# Patient Record
Sex: Male | Born: 1965 | Race: Black or African American | Hispanic: No | Smoking: Current every day smoker
Health system: Southern US, Community
[De-identification: ages and names within clinical notes are randomized; demographics above are authoritative.]

## PROBLEM LIST (undated history)

## (undated) DIAGNOSIS — I10 Essential (primary) hypertension: Secondary | ICD-10-CM

## (undated) DIAGNOSIS — R079 Chest pain, unspecified: Secondary | ICD-10-CM

## (undated) DIAGNOSIS — G44009 Cluster headache syndrome, unspecified, not intractable: Secondary | ICD-10-CM

## (undated) HISTORY — PX: WRIST SURGERY: SHX841

---
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
## 1989-01-02 HISTORY — PX: HAND SURGERY: SHX662

## 1997-08-02 ENCOUNTER — Emergency Department (HOSPITAL_COMMUNITY): Admission: EM | Admit: 1997-08-02 | Discharge: 1997-08-02 | Payer: Self-pay | Admitting: Emergency Medicine

## 2002-08-08 ENCOUNTER — Encounter: Payer: Self-pay | Admitting: Emergency Medicine

## 2002-08-08 ENCOUNTER — Emergency Department (HOSPITAL_COMMUNITY): Admission: EM | Admit: 2002-08-08 | Discharge: 2002-08-08 | Payer: Self-pay | Admitting: Emergency Medicine

## 2003-03-04 ENCOUNTER — Emergency Department (HOSPITAL_COMMUNITY): Admission: EM | Admit: 2003-03-04 | Discharge: 2003-03-04 | Payer: Self-pay | Admitting: Emergency Medicine

## 2003-07-13 ENCOUNTER — Emergency Department (HOSPITAL_COMMUNITY): Admission: EM | Admit: 2003-07-13 | Discharge: 2003-07-13 | Payer: Self-pay | Admitting: Emergency Medicine

## 2003-11-23 ENCOUNTER — Emergency Department (HOSPITAL_COMMUNITY): Admission: EM | Admit: 2003-11-23 | Discharge: 2003-11-24 | Payer: Self-pay | Admitting: Emergency Medicine

## 2003-12-06 ENCOUNTER — Emergency Department (HOSPITAL_COMMUNITY): Admission: EM | Admit: 2003-12-06 | Discharge: 2003-12-06 | Payer: Self-pay | Admitting: Emergency Medicine

## 2004-11-05 ENCOUNTER — Emergency Department (HOSPITAL_COMMUNITY): Admission: EM | Admit: 2004-11-05 | Discharge: 2004-11-05 | Payer: Self-pay | Admitting: Emergency Medicine

## 2005-06-30 ENCOUNTER — Emergency Department (HOSPITAL_COMMUNITY): Admission: EM | Admit: 2005-06-30 | Discharge: 2005-06-30 | Payer: Self-pay | Admitting: *Deleted

## 2006-02-08 ENCOUNTER — Emergency Department (HOSPITAL_COMMUNITY): Admission: EM | Admit: 2006-02-08 | Discharge: 2006-02-08 | Payer: Self-pay | Admitting: Emergency Medicine

## 2006-02-17 ENCOUNTER — Emergency Department (HOSPITAL_COMMUNITY): Admission: EM | Admit: 2006-02-17 | Discharge: 2006-02-17 | Payer: Self-pay | Admitting: Emergency Medicine

## 2009-05-24 ENCOUNTER — Emergency Department (HOSPITAL_COMMUNITY): Admission: EM | Admit: 2009-05-24 | Discharge: 2009-05-24 | Payer: Self-pay | Admitting: Emergency Medicine

## 2009-09-18 ENCOUNTER — Emergency Department (HOSPITAL_COMMUNITY): Admission: EM | Admit: 2009-09-18 | Discharge: 2009-09-19 | Payer: Self-pay | Admitting: Emergency Medicine

## 2009-09-20 ENCOUNTER — Ambulatory Visit (HOSPITAL_COMMUNITY): Admission: RE | Admit: 2009-09-20 | Discharge: 2009-09-21 | Payer: Self-pay | Admitting: Orthopedic Surgery

## 2009-11-26 ENCOUNTER — Emergency Department (HOSPITAL_COMMUNITY): Admission: EM | Admit: 2009-11-26 | Discharge: 2009-11-26 | Payer: Self-pay | Admitting: Emergency Medicine

## 2010-05-07 ENCOUNTER — Emergency Department (HOSPITAL_COMMUNITY)
Admission: EM | Admit: 2010-05-07 | Discharge: 2010-05-07 | Payer: Self-pay | Source: Home / Self Care | Admitting: Emergency Medicine

## 2010-05-07 LAB — BASIC METABOLIC PANEL
BUN: 11 mg/dL (ref 6–23)
CO2: 23 mEq/L (ref 19–32)
Calcium: 9.3 mg/dL (ref 8.4–10.5)
Chloride: 104 mEq/L (ref 96–112)
Creatinine, Ser: 1.07 mg/dL (ref 0.4–1.5)
GFR calc Af Amer: >60 mL/min (ref >60)
GFR calc non Af Amer: >60 mL/min (ref >60)
Glucose, Bld: 122 mg/dL — ABNORMAL HIGH (ref 70–99)
Potassium: 3.5 mEq/L (ref 3.5–5.1)
Sodium: 139 mEq/L (ref 135–145)

## 2010-05-07 LAB — CBC
HCT: 45.2 % (ref 39.0–52.0)
Hemoglobin: 16.1 g/dL (ref 13.0–17.0)
MCH: 32.7 pg (ref 26.0–34.0)
MCHC: 35.6 g/dL (ref 30.0–36.0)
MCV: 91.9 fL (ref 78.0–100.0)
Platelets: 229 10*3/uL (ref 150–400)
RBC: 4.92 MIL/uL (ref 4.22–5.81)
RDW: 13.2 % (ref 11.5–15.5)
WBC: 6.9 10*3/uL (ref 4.0–10.5)

## 2010-05-07 LAB — DIFFERENTIAL
Basophils Absolute: 0.1 10*3/uL (ref 0.0–0.1)
Basophils Relative: 1 % (ref 0–1)
Eosinophils Absolute: 0 10*3/uL (ref 0.0–0.7)
Eosinophils Relative: 0 % (ref 0–5)
Lymphocytes Relative: 19 % (ref 12–46)
Lymphs Abs: 1.3 10*3/uL (ref 0.7–4.0)
Monocytes Absolute: 0.7 10*3/uL (ref 0.1–1.0)
Monocytes Relative: 10 % (ref 3–12)
Neutro Abs: 4.9 10*3/uL (ref 1.7–7.7)
Neutrophils Relative %: 70 % (ref 43–77)

## 2010-05-07 LAB — POCT CARDIAC MARKERS
CKMB, poc: 1.8 ng/mL (ref 1.0–8.0)
CKMB, poc: 1.9 ng/mL (ref 1.0–8.0)
CKMB, poc: 1.9 ng/mL (ref 1.0–8.0)
Myoglobin, poc: 200 ng/mL (ref 12–200)
Myoglobin, poc: 102 ng/mL (ref 12–200)
Myoglobin, poc: 55.1 ng/mL (ref 12–200)
Troponin i, poc: <0.05 ng/mL (ref 0.00–0.09)
Troponin i, poc: <0.05 ng/mL (ref 0.00–0.09)
Troponin i, poc: <0.05 ng/mL (ref 0.00–0.09)

## 2010-05-07 LAB — D-DIMER, QUANTITATIVE: D-Dimer, Quant: <0.22 ug/mL-FEU (ref 0.00–0.48)

## 2010-07-21 LAB — CBC
HCT: 47.3 % (ref 39.0–52.0)
Hemoglobin: 16.3 g/dL (ref 13.0–17.0)
MCHC: 34.5 g/dL (ref 30.0–36.0)
MCV: 94.6 fL (ref 78.0–100.0)
Platelets: 257 10*3/uL (ref 150–400)
RBC: 5 MIL/uL (ref 4.22–5.81)
RDW: 13.7 % (ref 11.5–15.5)
WBC: 7.1 10*3/uL (ref 4.0–10.5)

## 2010-07-21 LAB — DIFFERENTIAL
Basophils Absolute: 0 10*3/uL (ref 0.0–0.1)
Basophils Relative: 1 % (ref 0–1)
Eosinophils Absolute: 0 10*3/uL (ref 0.0–0.7)
Eosinophils Relative: 0 % (ref 0–5)
Lymphocytes Relative: 29 % (ref 12–46)
Lymphs Abs: 2.1 10*3/uL (ref 0.7–4.0)
Monocytes Absolute: 0.7 10*3/uL (ref 0.1–1.0)
Monocytes Relative: 10 % (ref 3–12)
Neutro Abs: 4.3 10*3/uL (ref 1.7–7.7)
Neutrophils Relative %: 61 % (ref 43–77)

## 2010-07-21 LAB — BASIC METABOLIC PANEL
BUN: 6 mg/dL (ref 6–23)
CO2: 22 mEq/L (ref 19–32)
Calcium: 9.6 mg/dL (ref 8.4–10.5)
Chloride: 103 mEq/L (ref 96–112)
Creatinine, Ser: 1.12 mg/dL (ref 0.4–1.5)
GFR calc Af Amer: >60 mL/min (ref >60)
GFR calc non Af Amer: >60 mL/min (ref >60)
Glucose, Bld: 105 mg/dL — ABNORMAL HIGH (ref 70–99)
Potassium: 4.1 mEq/L (ref 3.5–5.1)
Sodium: 136 mEq/L (ref 135–145)

## 2010-07-21 LAB — PROTIME-INR
INR: 0.96 (ref 0.00–1.49)
Prothrombin Time: 12.7 seconds (ref 11.6–15.2)

## 2010-07-21 LAB — APTT: aPTT: 29 seconds (ref 24–37)

## 2011-03-11 ENCOUNTER — Encounter: Payer: Self-pay | Admitting: Emergency Medicine

## 2011-03-11 ENCOUNTER — Other Ambulatory Visit: Payer: Self-pay

## 2011-03-11 ENCOUNTER — Emergency Department (HOSPITAL_COMMUNITY): Payer: Self-pay

## 2011-03-11 ENCOUNTER — Emergency Department (HOSPITAL_COMMUNITY)
Admission: EM | Admit: 2011-03-11 | Discharge: 2011-03-11 | Disposition: A | Discharge status: Home / Self Care | Payer: Self-pay | Attending: Emergency Medicine | Admitting: Emergency Medicine

## 2011-03-11 DIAGNOSIS — R079 Chest pain, unspecified: Secondary | ICD-10-CM | POA: Insufficient documentation

## 2011-03-11 DIAGNOSIS — Z7982 Long term (current) use of aspirin: Secondary | ICD-10-CM | POA: Insufficient documentation

## 2011-03-11 DIAGNOSIS — R42 Dizziness and giddiness: Secondary | ICD-10-CM | POA: Insufficient documentation

## 2011-03-11 DIAGNOSIS — I10 Essential (primary) hypertension: Secondary | ICD-10-CM | POA: Insufficient documentation

## 2011-03-11 DIAGNOSIS — F17200 Nicotine dependence, unspecified, uncomplicated: Secondary | ICD-10-CM | POA: Insufficient documentation

## 2011-03-11 HISTORY — DX: Essential (primary) hypertension: I10

## 2011-03-11 LAB — POCT I-STAT TROPONIN I

## 2011-03-11 NOTE — ED Provider Notes (Signed)
History     CSN: [id number] Arrival date & time: 03/11/2011  6:09 PM   First MD Initiated Contact with Patient 03/11/11 1812      Chief Complaint  Patient presents with  . Chest Pain    (Consider location/radiation/quality/duration/timing/severity/associated sxs/prior treatment) The history is provided by the patient and medical records.   the patient is a 45 year old male, who smokes cigarettes.  He presents to the emergency department complaining of left-sided chest pain since 3 days ago.  He states that it is sharp and intermittent.  It lasts anywhere from minutes to up to an hour.  He has also had lightheadedness, which is so severe that he almost falls.  He denies shortness of breath, cough, nausea, vomiting, out.  The patient's leg pain or swelling.  He states he has had similar symptoms in the past and was evaluated in the emergency department.  He states that no etiology for his symptoms were found.  He denies a family history of coronary artery disease.  He has hypertension, but is not taking any medicines for it.  Past Medical History  Diagnosis Date  . Hypertension   . Migraine     Past Surgical History  Procedure Date  . Wrist surgery     left    No family history on file.  History  Substance Use Topics  . Smoking status: Current Everyday Smoker  . Smokeless tobacco: Never Used  . Alcohol Use: Yes     socail      Review of Systems  Constitutional: Negative for fever and diaphoresis.  HENT: Negative for neck pain.   Eyes: Negative for redness.  Respiratory: Negative for cough, chest tightness and shortness of breath.   Cardiovascular: Positive for chest pain. Negative for palpitations.  Gastrointestinal: Negative for nausea, vomiting, abdominal pain and diarrhea.  Musculoskeletal: Negative for back pain.  Neurological: Negative for headaches.  Psychiatric/Behavioral: Negative for confusion.    Allergies  Benadryl allergy  Home Medications   Current  Outpatient Rx  Name Route Sig Dispense Refill  . ASPIRIN 81 MG PO CHEW Oral Chew 81 mg by mouth daily.      Marland Kitchen FLUTICASONE PROPIONATE 50 MCG/ACT NA SUSP Nasal Place 2 sprays into the nose daily.        BP 131/89  Pulse 69  Temp(Src) 97.7 F (36.5 C) (Oral)  Resp 18  Wt 195 lb (88.451 kg)  SpO2 99%  Physical Exam  Constitutional: He is oriented to person, place, and time. He appears well-developed and well-nourished. No distress.  HENT:  Head: Normocephalic and atraumatic.  Eyes: EOM are normal. Pupils are equal, round, and reactive to light.  Neck: Normal range of motion. Neck supple.  Cardiovascular: Normal rate, regular rhythm, normal heart sounds and intact distal pulses.   No murmur heard. Pulmonary/Chest: Effort normal and breath sounds normal. No respiratory distress. He has no wheezes. He has no rales. He exhibits no tenderness.  Abdominal: Soft. Bowel sounds are normal. He exhibits no distension and no mass. There is no tenderness. There is no rebound and no guarding.  Musculoskeletal: Normal range of motion. He exhibits no edema and no tenderness.  Neurological: He is alert and oriented to person, place, and time. No cranial nerve deficit.  Skin: Skin is warm and dry. He is not diaphoretic.  Psychiatric: He has a normal mood and affect. His behavior is normal.    ED Course  Procedures (including critical care time)  45 year old male, with a history  of hypertension, presents with intermittent sharp, left-sided chest pain that does not radiate and is associated with no other symptoms of ACS.  He does have lightheadedness.  Recently, however, it does not correspond with the pain.  His physical examination is normal.  His EKG does not show signs of a STEMI at this time.  We will conduct a chest x-ray, and blood tests to look for signs of ACS, thoracic dissection, or other cardiopulmonary disease.  He does not want pain medications at this time.  ED ECG REPORT   Date:  03/11/2011  EKG Time: 18.08  Rate: 676  Rhythm: normal sinus rhythm,  Axis:normal  Intervals:none  ST&T Change: none   Narrative Interpretation:normal            Labs Reviewed  I-STAT TROPONIN I   No results found.   No diagnosis found.    MDM  Chest pain Intermittent chest pain that lasts 3 minutes to up to an hour without other signs of ACS.  Normal EKG, and no elevation of his troponin in the emergency department.  Asymptomatic in the emergency department.  I will release him to home.  No evidence of pneumonia or other significant illness        Nicholes Stairs, MD 03/11/11 2227

## 2011-03-11 NOTE — ED Notes (Signed)
Per ems pt began having left sided upper chest pain (sharp) on and off all day with increasing pain over past hour. Pt c/o nausea. Pt has hx htn and migraines and has not taken med in past month. Per ems bp 150/100 RR18 HR 80 with NSR on EKG, 4 SL nitro with no change in chest pain, #18G IV LFA, O2 @ 4L via [place], pt took 8- 81 mg ASA PTA. presently c/o dizziness and HA

## 2011-03-11 NOTE — ED Notes (Signed)
Report received from S. Buff, RN. 

## 2012-01-29 ENCOUNTER — Observation Stay (HOSPITAL_COMMUNITY)
Admission: EM | Admit: 2012-01-29 | Discharge: 2012-01-30 | Disposition: A | Discharge status: Home / Self Care | Payer: Self-pay | Attending: Internal Medicine | Admitting: Internal Medicine

## 2012-01-29 ENCOUNTER — Encounter (HOSPITAL_COMMUNITY): Payer: Self-pay | Admitting: Emergency Medicine

## 2012-01-29 DIAGNOSIS — I10 Essential (primary) hypertension: Secondary | ICD-10-CM

## 2012-01-29 DIAGNOSIS — L0291 Cutaneous abscess, unspecified: Secondary | ICD-10-CM

## 2012-01-29 DIAGNOSIS — L738 Other specified follicular disorders: Principal | ICD-10-CM | POA: Insufficient documentation

## 2012-01-29 DIAGNOSIS — L0390 Cellulitis, unspecified: Secondary | ICD-10-CM

## 2012-01-29 DIAGNOSIS — L739 Follicular disorder, unspecified: Secondary | ICD-10-CM

## 2012-01-29 LAB — BASIC METABOLIC PANEL
Chloride: 103 mEq/L (ref 96–112)
GFR calc Af Amer: >90 mL/min (ref >90)
Potassium: 3.8 mEq/L (ref 3.5–5.1)

## 2012-01-29 LAB — CBC
HCT: 47.1 % (ref 39.0–52.0)
Platelets: 315 10*3/uL (ref 150–400)
RDW: 12.9 % (ref 11.5–15.5)
WBC: 7.3 10*3/uL (ref 4.0–10.5)

## 2012-01-29 MED ORDER — PNEUMOCOCCAL VAC POLYVALENT 25 MCG/0.5ML IJ INJ: 0.5000 mL | INJECTION | INTRAMUSCULAR | Status: DC

## 2012-01-29 MED ORDER — SODIUM CHLORIDE 0.9 % IV SOLN
INTRAVENOUS | Status: DC
  Administered 2012-01-29: 15:00:00 via INTRAVENOUS

## 2012-01-29 MED ORDER — MORPHINE SULFATE 4 MG/ML IJ SOLN
4.0000 mg | Freq: Once | INTRAMUSCULAR | Status: AC
  Administered 2012-01-29: 4 mg via INTRAVENOUS
  Filled 2012-01-29: qty 1

## 2012-01-29 MED ORDER — CLINDAMYCIN PHOSPHATE 600 MG/50ML IV SOLN
600.0000 mg | Freq: Three times a day (TID) | INTRAVENOUS | Status: DC
  Administered 2012-01-29 – 2012-01-30 (×3): 600 mg via INTRAVENOUS
  Filled 2012-01-29 (×4): qty 50

## 2012-01-29 MED ORDER — ONDANSETRON HCL 4 MG/2ML IJ SOLN: 4.0000 mg | Freq: Four times a day (QID) | INTRAMUSCULAR | Status: DC | PRN

## 2012-01-29 MED ORDER — ACETAMINOPHEN 325 MG PO TABS: 650.0000 mg | ORAL_TABLET | Freq: Four times a day (QID) | ORAL | Status: DC | PRN

## 2012-01-29 MED ORDER — ENOXAPARIN SODIUM 40 MG/0.4ML ~~LOC~~ SOLN
40.0000 mg | SUBCUTANEOUS | Status: DC
  Filled 2012-01-29 (×2): qty 0.4

## 2012-01-29 MED ORDER — ACETAMINOPHEN 650 MG RE SUPP: 650.0000 mg | Freq: Four times a day (QID) | RECTAL | Status: DC | PRN

## 2012-01-29 MED ORDER — CLINDAMYCIN PHOSPHATE 600 MG/50ML IV SOLN
600.0000 mg | Freq: Once | INTRAVENOUS | Status: AC
  Administered 2012-01-29: 600 mg via INTRAVENOUS
  Filled 2012-01-29: qty 50

## 2012-01-29 MED ORDER — MORPHINE SULFATE 2 MG/ML IJ SOLN
1.0000 mg | INTRAMUSCULAR | Status: DC | PRN
  Filled 2012-01-29: qty 1

## 2012-01-29 MED ORDER — OXYCODONE HCL 5 MG PO TABS
5.0000 mg | ORAL_TABLET | ORAL | Status: DC | PRN
  Administered 2012-01-29: 5 mg via ORAL
  Filled 2012-01-29: qty 1

## 2012-01-29 MED ORDER — SENNOSIDES-DOCUSATE SODIUM 8.6-50 MG PO TABS
1.0000 | ORAL_TABLET | Freq: Every evening | ORAL | Status: DC | PRN
  Filled 2012-01-29: qty 1

## 2012-01-29 MED ORDER — ONDANSETRON HCL 4 MG PO TABS: 4.0000 mg | ORAL_TABLET | Freq: Four times a day (QID) | ORAL | Status: DC | PRN

## 2012-01-29 NOTE — Progress Notes (Signed)
Nutrition Brief Note  Patient identified on the Malnutrition Screening Tool (MST) report for unintended weight loss and poor appetite, generating a score of 3.   Body mass index is 26.85 kg/(m^2). Pt meets criteria for overweight based on current BMI.   - Pt reports "eating like a hog" PTA with great appetite. Pt reports 2 pound intentional weight loss in the past week or so r/t pt walking more. Pt without any nutrition educational needs. No nutrition interventions warranted at this time. If nutrition issues arise, please consult RD.   Levon Hedger MS, RD, LDN [phone] Pager [phone] After Hours Pager

## 2012-01-29 NOTE — H&P (Addendum)
[facility]          History and Physical    PCP:   No primary provider on file.   Chief Complaint:  Pain and swelling of upper thigh area.  HPI: 46 y/o man with PMH significant for HTN, who has not had routine medical care in years, presents with the above-mentioned complaints. 2 weeks ago he had a "boil" pop up in his groin. This burst and drained some pus and resolved spontaneously. Since then he has had 3-4 other similar lesions occur. They are quite painful and because of the location, difficult ambulation. I am seeing him up on the floor as he had already been accepted for admission by the time I see him. Denies fevers/chills.  Allergies:   Allergies  Allergen Reactions  . Diphenhydramine Hcl Other (See Comments)    Confusion, "I went crazy"      Past Medical History  Diagnosis Date  . Hypertension   . Migraine     Past Surgical History  Procedure Date  . Wrist surgery     left    Prior to Admission medications   Medication Sig Start Date End Date Taking? Authorizing Provider  ibuprofen (ADVIL,MOTRIN) 200 MG tablet Take 800 mg by mouth every 6 (six) hours as needed. PAIN   Yes Historical Provider, MD  PRESCRIPTION MEDICATION Take 1 tablet by mouth daily. PT STATES HE TAKES A BLOOD PRESSURE HOWEVER CANT FIND IT AT PT'S PHARMACY   Yes Historical Provider, MD    Social History:  reports that he has been smoking Cigarettes.  He has a 25 pack-year smoking history. He has never used smokeless tobacco. He reports that he drinks alcohol. He reports that he does not use illicit drugs.  History reviewed. No pertinent family history.  Review of Systems:  Constitutional: Denies fever, chills, diaphoresis, appetite change and fatigue.  HEENT: Denies photophobia, eye pain, redness, hearing loss, ear pain, congestion, sore throat, rhinorrhea, sneezing, mouth sores, trouble swallowing, neck pain, neck stiffness and tinnitus.   Respiratory: Denies SOB, DOE,  cough, chest tightness,  and wheezing.   Cardiovascular: Denies chest pain, palpitations and leg swelling.  Gastrointestinal: Denies nausea, vomiting, abdominal pain, diarrhea, constipation, blood in stool and abdominal distention.  Genitourinary: Denies dysuria, urgency, frequency, hematuria, flank pain and difficulty urinating.  Musculoskeletal: Denies myalgias, back pain, joint swelling, arthralgias and gait problem.  Skin: Denies pallor, rash and wound.  Neurological: Denies dizziness, seizures, syncope, weakness, light-headedness, numbness and headaches.  Hematological: Denies adenopathy. Easy bruising, personal or family bleeding history  Psychiatric/Behavioral: Denies suicidal ideation, mood changes, confusion, nervousness, sleep disturbance and agitation   Physical Exam: Blood pressure 144/81, pulse 85, temperature 97.6 F (36.4 C), temperature source Oral, resp. rate 18, height 6' (1.829 m), weight 89.812 kg (198 lb), SpO2 94.00%. Gen: AAOx3, mild headache. HEENT: [place]/AT/PERRL/EOMI, moist mucous membranes. Neck: supple, no JVD, no LAD, no bruits, no goiter. CV: RRR, no M/R/G Lungs: CTA B Abd: S/NT/ND/+BS/no masses or organomegaly noted. Ext: no C/C/E, +pedal pulses. Neuro: grossly intact and nonfocal.   Labs on Admission:  Results for orders placed during the hospital encounter of 01/29/12 (from the past 48 hour(s))  CBC     Status: Normal   Collection Time   01/29/12  9:00 AM      Component Value Range Comment   WBC 7.3  4.0 - 10.5 K/uL    RBC 5.32  4.22 - 5.81 MIL/uL    Hemoglobin 16.8  13.0 - 17.0 g/dL  HCT 47.1  39.0 - 52.0 %    MCV 88.5  78.0 - 100.0 fL    MCH 31.6  26.0 - 34.0 pg    MCHC 35.7  30.0 - 36.0 g/dL    RDW 82.9  56.2 - 13.0 %    Platelets 315  150 - 400 K/uL   BASIC METABOLIC PANEL     Status: Normal   Collection Time   01/29/12  9:00 AM      Component Value Range Comment   Sodium 140  135 - 145 mEq/L    Potassium 3.8  3.5 - 5.1 mEq/L    Chloride  103  96 - 112 mEq/L    CO2 23  19 - 32 mEq/L    Glucose, Bld 86  70 - 99 mg/dL    BUN 6  6 - 23 mg/dL    Creatinine, Ser 8.65  0.50 - 1.35 mg/dL    Calcium 8.7  8.4 - 78.4 mg/dL    GFR calc non Af Amer >90  >90 mL/min    GFR calc Af Amer >90  >90 mL/min     Radiological Exams on Admission: No results found.  Assessment/Plan Principal Problem:  *Folliculitis Active Problems:  HTN (hypertension)   Folliculitis -Prior bouts have resolved spontaneously. -Has already been accepted for admission and is on the floor. -Continue IV clindamycin today. -Plan to transition to PO abx in am for DC.  HTN -Not currently on meds. -No routine medical care. -Follow BP, but no plans to start treatment while here unless BP markedly elevated.  DVT Prophylaxis -Lovenox.  Disposition -Plan for DC home in am. -Will ask CM to assist with PCP follow up needs.   Time Spent on Admission: 35 minutes  Benjamin Novak,Benjamin Novak [facility] Pager: [phone] 01/29/2012, 11:36 AM

## 2012-01-29 NOTE — Progress Notes (Signed)
Pt is 46 year old male with history of HTN who presents to ED with worsening erythema of scrotal area, bilateral upper thighs.  Patient was started on clindamycin per ED physician. Vital signs reviewed. May admit to med-surg floor.  Manson Passey [facility]  [phone]

## 2012-01-29 NOTE — ED Notes (Signed)
States had a boil that started 2 weeks ago in perineal area, went away-- now has 4-5 on both thighs and scrotal area. Draining greenish pus.

## 2012-01-29 NOTE — ED Provider Notes (Signed)
History     CSN: [id number]  Arrival date & time 01/29/12  0756   First MD Initiated Contact with Patient 01/29/12 [phone]      Chief Complaint  Patient presents with  . boils   . Abscess    (Consider location/radiation/quality/duration/timing/severity/associated sxs/prior treatment) HPI Comments: Mr. Keena reports that he has developed multiple painful red spots and boils all over his upper legs.  He had 1 are on his left groin that became swollen, drained pus, then resolved; however shortly thereafter multiple other areas lesions have erupted.  He denies ongoing groin or scrotal pain/swelling.   He denies any other medical problems other than hypertension and migraine headaches.   Patient is a 46 y.o. male presenting with abscess. The history is provided by the patient. No language interpreter was used.  Abscess  The current episode started more than one week ago (2 weeks ago). The onset was gradual. The problem occurs continuously. The problem has been gradually worsening. The abscess is present on the groin, left upper leg and left lower leg. The problem is severe. The abscess is characterized by redness, painfulness, blistering and swelling. The abscess first occurred at home. Pertinent negatives include no anorexia, no decrease in physical activity, not sleeping less, no fever, not sleeping more, no vomiting, no rhinorrhea, no decreased responsiveness and no cough. His past medical history is significant for skin abscesses in family. Past medical history comments: reports his sister-in-law has had a recent similar eruption. He has received no recent medical care.    Past Medical History  Diagnosis Date  . Hypertension   . Migraine     Past Surgical History  Procedure Date  . Wrist surgery     left    History reviewed. No pertinent family history.  History  Substance Use Topics  . Smoking status: Current Every Day Smoker  . Smokeless tobacco: Never Used  . Alcohol Use:  Yes     social       Review of Systems  Constitutional: Negative for fever, diaphoresis, activity change, appetite change, decreased responsiveness and fatigue.  HENT: Negative.  Negative for rhinorrhea.   Eyes: Negative.   Respiratory: Negative for cough.   Gastrointestinal: Negative for vomiting and anorexia.  Genitourinary: Negative.   Musculoskeletal: Positive for myalgias. Negative for back pain, arthralgias and gait problem.  Skin: Positive for color change, rash and wound. Negative for pallor.  Neurological: Negative for dizziness, tremors, weakness and light-headedness.  Psychiatric/Behavioral: Negative.     Allergies  Diphenhydramine hcl  Home Medications   Current Outpatient Rx  Name Route Sig Dispense Refill  . IBUPROFEN 200 MG PO TABS Oral Take 800 mg by mouth every 6 (six) hours as needed. PAIN    . PRESCRIPTION MEDICATION Oral Take 1 tablet by mouth daily. PT STATES HE TAKES A BLOOD PRESSURE HOWEVER CANT FIND IT AT PT'S PHARMACY      BP 120/98  Pulse 101  Temp 98.7 F (37.1 C) (Oral)  Resp 16  SpO2 98%  Physical Exam  Nursing note and vitals reviewed. Constitutional: He is oriented to person, place, and time. He appears well-developed and well-nourished. No distress.  HENT:  Head: Normocephalic and atraumatic.  Right Ear: External ear normal.  Left Ear: External ear normal.  Mouth/Throat: Oropharynx is clear and moist. No oropharyngeal exudate.  Eyes: Conjunctivae normal and EOM are normal. Pupils are equal, round, and reactive to light. Right eye exhibits no discharge. Left eye exhibits no discharge. No scleral  icterus.  Neck: Normal range of motion. Neck supple. No JVD present. No tracheal deviation present. No thyromegaly present.  Cardiovascular: Normal rate, regular rhythm, normal heart sounds and intact distal pulses.  Exam reveals no gallop and no friction rub.   No murmur heard. Pulmonary/Chest: Effort normal and breath sounds normal. No stridor.  No respiratory distress. He has no wheezes. He has no rales. He exhibits no tenderness.  Abdominal: Soft. Bowel sounds are normal. He exhibits no distension and no mass. There is no tenderness. There is no rebound and no guarding.  Musculoskeletal: Normal range of motion. He exhibits no edema and no tenderness.  Lymphadenopathy:    He has no cervical adenopathy.  Neurological: He is alert and oriented to person, place, and time.  Skin: Skin is warm and dry. Lesion and rash noted. No abrasion, no bruising, no burn, no ecchymosis, no laceration and no petechiae noted. Rash is pustular. He is not diaphoretic. There is erythema. No cyanosis. No pallor. Nails show no clubbing.          Multiple erythematous patches on anterior and medial thighs. Some have overlying pustules.  Several other darkened dry areas with closed sinuses noted  Psychiatric: He has a normal mood and affect. His behavior is normal.    ED Course  Procedures (including critical care time)   Labs Reviewed  CBC  BASIC METABOLIC PANEL   No results found.   No diagnosis found.    MDM  Pt presents for evaluation of multiple erythematous areas on his legs.  It started as a "boil" in his  Groin which ruptured and improved.  He appears uncomfortable  But stable, afebrile, NAD.  Plan basic labs, pain mgmnt, and IV clindamycin.  Some of the areas are tense with small overlying blisters.  No palpable area of fluctuance appreciated. 1478.  Pt stable, NAD.  Note nl CBC and BNP.  Administered clindamycin.  Secondary to multiple small bilateral cellulitic areas, plan observation admission for continued parenteral antibiotics.  Pt at this time demonstrates no clinical evidence of a sepsis and otherwise appears well.      Tobin Chad, MD 01/29/12 1003

## 2012-01-30 LAB — CBC
HCT: 44.9 % (ref 39.0–52.0)
MCHC: 34.3 g/dL (ref 30.0–36.0)
MCV: 90.5 fL (ref 78.0–100.0)
RDW: 13.3 % (ref 11.5–15.5)

## 2012-01-30 LAB — BASIC METABOLIC PANEL
BUN: 18 mg/dL (ref 6–23)
CO2: 26 mEq/L (ref 19–32)
Chloride: 101 mEq/L (ref 96–112)
Creatinine, Ser: 1.09 mg/dL (ref 0.50–1.35)

## 2012-01-30 MED ORDER — DOXYCYCLINE HYCLATE 100 MG PO TABS: 100.0000 mg | ORAL_TABLET | Freq: Two times a day (BID) | ORAL | Status: DC

## 2012-01-30 NOTE — Progress Notes (Signed)
Pt [place] cigarettes placed in shadow chart with lighter.

## 2012-01-30 NOTE — Progress Notes (Signed)
After receiving and signing for his instructions and prescription, I escort him to [facility] for his ride home.  This is accomplished without incident; and he ambulates without difficulty.

## 2012-01-30 NOTE — Discharge Summary (Signed)
Physician Discharge Summary  Patient ID: Benjamin Novak MRN: [id number] DOB/AGE: 1966-01-14 46 y.o.  Admit date: 01/29/2012 Discharge date: 01/30/2012  Primary Care Physician:  No primary provider on file.   Discharge Diagnoses:    Principal Problem:  *Folliculitis Active Problems:  HTN (hypertension)      Medication List     As of 01/30/2012  9:58 AM    STOP taking these medications         PRESCRIPTION MEDICATION      TAKE these medications         doxycycline 100 MG tablet   Commonly known as: VIBRA-TABS   Take 1 tablet (100 mg total) by mouth 2 (two) times daily.      ibuprofen 200 MG tablet   Commonly known as: ADVIL,MOTRIN   Take 800 mg by mouth every 6 (six) hours as needed. PAIN         Disposition and Follow-up:  Will be discharged home today in stable and improved condition. Has been advised to follow up with his Primary care provider in 2 weeks.  Consults:  None   Significant Diagnostic Studies:  No results found.  Brief H and P: For complete details please refer to admission H and P, but in brief patient is a 46 y/o man with PMH significant for HTN, who has not had routine medical care in years, presents with pain and swelling of the upper thigh area. 2 weeks ago he had a "boil" pop up in his groin. This burst and drained some pus and resolved spontaneously. Since then he has had 3-4 other similar lesions occur. They are quite painful and because of the location, difficult ambulation. I am seeing him up on the floor as he had already been accepted for admission by the time I see him. Denies fevers/chills.     Hospital Course:  Principal Problem:  *Folliculitis Active Problems:  HTN (hypertension)    Folliculitis -No fever/leukocytosis. -Has received 24 hrs of IV clindamycin. -Will prescribe a 10 day course of doxycycline at time of DC. -Instructed to follow up with his PCP in 2 weeks if the lesions are still bothersome.  HTN -Has  not been taking any meds. -SBP 120-140 while in the hospital. -Will defer treatment of this to his OP provider as I do not think that this is the moment to start any new BP meds.  Time spent on Discharge: Less than 30 minutes.  SignedChaya Jan [facility] Pager: [phone] 01/30/2012, 9:58 AM

## 2012-11-08 ENCOUNTER — Ambulatory Visit: Payer: Self-pay

## 2012-11-08 ENCOUNTER — Other Ambulatory Visit: Payer: Self-pay | Admitting: Occupational Medicine

## 2012-11-08 DIAGNOSIS — M549 Dorsalgia, unspecified: Secondary | ICD-10-CM

## 2013-01-04 ENCOUNTER — Observation Stay (HOSPITAL_COMMUNITY)
Admission: EM | Admit: 2013-01-04 | Discharge: 2013-01-05 | Disposition: A | Discharge status: Home / Self Care | Payer: Self-pay | Attending: Internal Medicine | Admitting: Internal Medicine

## 2013-01-04 ENCOUNTER — Emergency Department (HOSPITAL_COMMUNITY): Payer: Self-pay

## 2013-01-04 ENCOUNTER — Encounter (HOSPITAL_COMMUNITY): Payer: Self-pay | Admitting: Emergency Medicine

## 2013-01-04 DIAGNOSIS — R079 Chest pain, unspecified: Principal | ICD-10-CM

## 2013-01-04 DIAGNOSIS — R42 Dizziness and giddiness: Secondary | ICD-10-CM | POA: Insufficient documentation

## 2013-01-04 DIAGNOSIS — F17200 Nicotine dependence, unspecified, uncomplicated: Secondary | ICD-10-CM | POA: Insufficient documentation

## 2013-01-04 DIAGNOSIS — I10 Essential (primary) hypertension: Secondary | ICD-10-CM | POA: Diagnosis present

## 2013-01-04 DIAGNOSIS — L739 Follicular disorder, unspecified: Secondary | ICD-10-CM

## 2013-01-04 DIAGNOSIS — Z720 Tobacco use: Secondary | ICD-10-CM

## 2013-01-04 LAB — URINALYSIS, ROUTINE W REFLEX MICROSCOPIC
Nitrite: NEGATIVE
Protein, ur: NEGATIVE mg/dL
Specific Gravity, Urine: 1.021 (ref 1.005–1.030)
Urobilinogen, UA: 0.2 mg/dL (ref 0.0–1.0)

## 2013-01-04 LAB — RAPID URINE DRUG SCREEN, HOSP PERFORMED
Cocaine: NOT DETECTED
Opiates: POSITIVE — AB
Tetrahydrocannabinol: NOT DETECTED

## 2013-01-04 LAB — BASIC METABOLIC PANEL
Calcium: 9 mg/dL (ref 8.4–10.5)
GFR calc non Af Amer: >90 mL/min (ref >90)
Glucose, Bld: 114 mg/dL — ABNORMAL HIGH (ref 70–99)
Sodium: 137 mEq/L (ref 135–145)

## 2013-01-04 LAB — CBC
Hemoglobin: 15.5 g/dL (ref 13.0–17.0)
MCH: 32.4 pg (ref 26.0–34.0)
MCHC: 35.6 g/dL (ref 30.0–36.0)
Platelets: 297 10*3/uL (ref 150–400)

## 2013-01-04 LAB — TROPONIN I: Troponin I: <0.3 ng/mL (ref <0.30)

## 2013-01-04 LAB — URINE MICROSCOPIC-ADD ON

## 2013-01-04 LAB — POCT I-STAT TROPONIN I: Troponin i, poc: 0.02 ng/mL (ref 0.00–0.08)

## 2013-01-04 MED ORDER — ACETAMINOPHEN 325 MG PO TABS
650.0000 mg | ORAL_TABLET | ORAL | Status: DC | PRN
  Filled 2013-01-04: qty 2

## 2013-01-04 MED ORDER — NITROGLYCERIN 0.4 MG SL SUBL
0.4000 mg | SUBLINGUAL_TABLET | SUBLINGUAL | Status: DC | PRN
  Administered 2013-01-04: 0.4 mg via SUBLINGUAL

## 2013-01-04 MED ORDER — ONDANSETRON HCL 4 MG/2ML IJ SOLN
4.0000 mg | Freq: Four times a day (QID) | INTRAMUSCULAR | Status: DC | PRN
  Filled 2013-01-04: qty 2

## 2013-01-04 MED ORDER — MORPHINE SULFATE 2 MG/ML IJ SOLN
2.0000 mg | INTRAMUSCULAR | Status: DC | PRN
  Administered 2013-01-05: 2 mg via INTRAVENOUS
  Filled 2013-01-04: qty 1

## 2013-01-04 MED ORDER — MORPHINE SULFATE 4 MG/ML IJ SOLN
4.0000 mg | Freq: Once | INTRAMUSCULAR | Status: AC
  Administered 2013-01-04: 4 mg via INTRAVENOUS
  Filled 2013-01-04: qty 1

## 2013-01-04 NOTE — ED Notes (Signed)
Pt denies taking BP medication in over a month, sts he felt like he didn't need it any longer. Wasn't told to stop it just decided that on his own. Reports he would check it at [facility] every other day, and it was running "normal" unable to tell what it was. Pt sts he was watching tv today when his CP started, he took up and felt dizzy so then he called EMS. Pt denies having cardiologist/stress test in past. Pt in nad, skin warm and dry, resp e/u.

## 2013-01-04 NOTE — ED Provider Notes (Signed)
CSN: [id number]     Arrival date & time 01/04/13  1346 History   First MD Initiated Contact with Patient 01/04/13 1502     Chief Complaint  Patient presents with  . Chest Pain   (Consider location/radiation/quality/duration/timing/severity/associated sxs/prior Treatment) Patient is a 47 y.o. male presenting with chest pain.  Chest Pain Pain location:  L chest Pain quality: pressure (first sharp, then pressure) and sharp   Pain radiates to:  L arm Pain severity:  Moderate Onset quality:  Sudden Duration:  2 hours Timing:  Constant Progression:  Unchanged Chronicity:  New Context: at rest   Relieved by: morphine, nitro. Worsened by:  Nothing tried Associated symptoms: dizziness and shortness of breath   Associated symptoms: no abdominal pain, no cough, no fever, no nausea and not vomiting     Past Medical History  Diagnosis Date  . Hypertension   . Migraine    Past Surgical History  Procedure Laterality Date  . Wrist surgery      left   No family history on file. History  Substance Use Topics  . Smoking status: Current Every Day Smoker -- 1.00 packs/day for 25 years    Types: Cigarettes  . Smokeless tobacco: Never Used  . Alcohol Use: Yes     Comment: social     Review of Systems  Constitutional: Negative for fever.  HENT: Negative for congestion.   Respiratory: Positive for shortness of breath. Negative for cough.   Cardiovascular: Positive for chest pain.  Gastrointestinal: Negative for nausea, vomiting, abdominal pain and diarrhea.  Neurological: Positive for dizziness.  All other systems reviewed and are negative.    Allergies  Diphenhydramine hcl  Home Medications   Current Outpatient Rx  Name  Route  Sig  Dispense  Refill  . Aspirin-Acetaminophen-Caffeine (GOODY HEADACHE PO)   Oral   Take 1 packet by mouth 2 (two) times daily as needed. For pain          BP 142/81  Pulse 69  Temp(Src) 98.3 F (36.8 C) (Oral)  Resp 33  SpO2 95% Physical  Exam  Nursing note and vitals reviewed. Constitutional: He is oriented to person, place, and time. He appears well-developed and well-nourished. No distress.  HENT:  Head: Normocephalic and atraumatic.  Mouth/Throat: Oropharynx is clear and moist.  Eyes: Conjunctivae are normal. Pupils are equal, round, and reactive to light. No scleral icterus.  Neck: Neck supple.  Cardiovascular: Normal rate, regular rhythm, normal heart sounds and intact distal pulses.   No murmur heard. Pulmonary/Chest: Effort normal and breath sounds normal. No stridor. No respiratory distress. He has no wheezes. He has no rales.  Abdominal: Soft. He exhibits no distension. There is no tenderness.  Musculoskeletal: Normal range of motion. He exhibits no edema and no tenderness.  Neurological: He is alert and oriented to person, place, and time.  Skin: Skin is warm and dry. No rash noted.  Psychiatric: He has a normal mood and affect. His behavior is normal.    ED Course  Procedures (including critical care time) Labs Review All labs drawn in ED reviewed. Imaging Review Dg Chest 2 View  01/04/2013   *RADIOLOGY REPORT*  Clinical Data: Chest pain  CHEST - 2 VIEW  Comparison: March 11, 2011  Findings:  Lungs clear.  Heart size and pulmonary vascularity are normal.  No adenopathy.  No bone lesions.  No pneumothorax.  IMPRESSION: No abnormality noted.   Original Report Authenticated By: Bretta Bang, M.D.  All radiology studies independently  viewed by me.     EKG - Sinus tachy, rate 100, normal axis, QTc 487, ST changes consistent with early repolarization which are consistent with prior.    MDM   1. Chest pain   2. HTN (hypertension)   3. Tobacco abuse    TIMI 2 for risk factors and aspirin use.  Father with early MI.  Initial workup reassuring, but story is concerning.  Admitted for further workup.  PE unlikely based on history and he is PERC negative. Pt took 325 mg Aspirin at home just after pain started.   Given nitro and morphine in ED with some improvement.     Candyce Churn, MD 01/05/13 1154

## 2013-01-04 NOTE — ED Notes (Addendum)
Report given RN.Pt needs urine drug screen done.Pocket knife sent to the floor with the pt.

## 2013-01-04 NOTE — ED Notes (Signed)
Per EMS - pt had sudden onset of sharp CP and left arm pain, 10/10 initially, 7/10 at rest, pt given 6 SL Nitro then now pain feels like pressure pain at 8/10. EMS administered 10 mg of morphine and 324 ASA. Pt sts after the meds he started to have pain in bilateral upper extremities. EMS started a 18G in left forearm. Initially BP 200/120 now BP 160/90 HR 120s. 12 lead unremarkable.
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
## 2013-01-04 NOTE — ED Notes (Addendum)
  Pt unable to void urinal at bedside  

## 2013-01-05 ENCOUNTER — Encounter (HOSPITAL_COMMUNITY): Payer: Self-pay

## 2013-01-05 ENCOUNTER — Observation Stay (HOSPITAL_COMMUNITY): Payer: MEDICAID

## 2013-01-05 DIAGNOSIS — R079 Chest pain, unspecified: Secondary | ICD-10-CM

## 2013-01-05 LAB — TROPONIN I: Troponin I: <0.3 ng/mL (ref <0.30)

## 2013-01-05 MED ORDER — REGADENOSON 0.4 MG/5ML IV SOLN
0.4000 mg | Freq: Once | INTRAVENOUS | Status: DC
  Filled 2013-01-05: qty 5

## 2013-01-05 MED ORDER — LISINOPRIL 10 MG PO TABS: 10.0000 mg | ORAL_TABLET | Freq: Every day | ORAL | Status: DC

## 2013-01-05 MED ORDER — TECHNETIUM TC 99M SESTAMIBI GENERIC - CARDIOLITE
10.0000 | Freq: Once | INTRAVENOUS | Status: AC | PRN
  Administered 2013-01-05: 10 via INTRAVENOUS

## 2013-01-05 MED ORDER — REGADENOSON 0.4 MG/5ML IV SOLN
INTRAVENOUS | Status: AC
  Filled 2013-01-05: qty 5
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
## 2013-01-05 NOTE — Progress Notes (Signed)
Pts assessment unchanged from this am. D/c'd home to private vehicle with family

## 2013-01-05 NOTE — Progress Notes (Signed)
CSW received consult and will notify RN CM. CSW signing off. Please reconsult if other needs arise.   Lary Eckardt, LCSWA [phone]

## 2013-01-05 NOTE — Progress Notes (Signed)
Primary team spoke with Flavia Shipper this AM who approved nuclear stress test.  Initially Dr. Benjamine Mola ordered [facility]. However, the computer system cued up an allergic reaction to Lexiscan given his allergy to Benadryl. I personally discussed with inpatient pharmacy who could not find any clinically significant evidence that a benadryl allergy would convey risk with Lexiscan. When the patient was provided this information, he refused Lexiscan. He was willing to walk on the treadmill and primary team was notified of the change.  Exercise tolerance was moderately reduced. He exercised 6:20 on bruce protocol, BP up to 211/77 systolic at peak exercise. The patient requested termination of exercise without prior warning therefore test was discontinued and Cardiolite was not injected. Max HR 120, target HR 149 -- HR came down quickly with termination of exercise thus test was nondiagnostic. With max exercise he c/o feeling "short winded" & leg fatigue but no CP. No CP in recovery either. Sx resolved quickly. BP coming down as well. No acute EKG changes during or after exercise. Will process resting images. Primary team will be notified.  Dayna Dunn PA-C

## 2013-02-10 ENCOUNTER — Emergency Department (HOSPITAL_COMMUNITY)
Admission: EM | Admit: 2013-02-10 | Discharge: 2013-02-10 | Disposition: A | Discharge status: Home / Self Care | Payer: No Typology Code available for payment source | Attending: Emergency Medicine | Admitting: Emergency Medicine

## 2013-02-10 ENCOUNTER — Emergency Department (HOSPITAL_COMMUNITY): Payer: No Typology Code available for payment source

## 2013-02-10 ENCOUNTER — Encounter (HOSPITAL_COMMUNITY): Payer: Self-pay | Admitting: Emergency Medicine

## 2013-02-10 DIAGNOSIS — IMO0002 Reserved for concepts with insufficient information to code with codable children: Secondary | ICD-10-CM | POA: Insufficient documentation

## 2013-02-10 DIAGNOSIS — R112 Nausea with vomiting, unspecified: Secondary | ICD-10-CM | POA: Insufficient documentation

## 2013-02-10 DIAGNOSIS — F17200 Nicotine dependence, unspecified, uncomplicated: Secondary | ICD-10-CM | POA: Insufficient documentation

## 2013-02-10 DIAGNOSIS — Z79899 Other long term (current) drug therapy: Secondary | ICD-10-CM | POA: Insufficient documentation

## 2013-02-10 DIAGNOSIS — G43909 Migraine, unspecified, not intractable, without status migrainosus: Secondary | ICD-10-CM | POA: Insufficient documentation

## 2013-02-10 DIAGNOSIS — Z791 Long term (current) use of non-steroidal anti-inflammatories (NSAID): Secondary | ICD-10-CM | POA: Insufficient documentation

## 2013-02-10 DIAGNOSIS — M549 Dorsalgia, unspecified: Secondary | ICD-10-CM

## 2013-02-10 DIAGNOSIS — Z7982 Long term (current) use of aspirin: Secondary | ICD-10-CM | POA: Insufficient documentation

## 2013-02-10 DIAGNOSIS — S0990XA Unspecified injury of head, initial encounter: Secondary | ICD-10-CM | POA: Insufficient documentation

## 2013-02-10 DIAGNOSIS — I10 Essential (primary) hypertension: Secondary | ICD-10-CM | POA: Insufficient documentation

## 2013-02-10 DIAGNOSIS — S0993XA Unspecified injury of face, initial encounter: Secondary | ICD-10-CM | POA: Insufficient documentation

## 2013-02-10 DIAGNOSIS — Y9389 Activity, other specified: Secondary | ICD-10-CM | POA: Insufficient documentation

## 2013-02-10 DIAGNOSIS — M542 Cervicalgia: Secondary | ICD-10-CM

## 2013-02-10 DIAGNOSIS — Y92410 Unspecified street and highway as the place of occurrence of the external cause: Secondary | ICD-10-CM | POA: Insufficient documentation

## 2013-02-10 LAB — URINALYSIS, DIPSTICK ONLY
Bilirubin Urine: NEGATIVE
Hgb urine dipstick: NEGATIVE
Specific Gravity, Urine: 1.023 (ref 1.005–1.030)
Urobilinogen, UA: 0.2 mg/dL (ref 0.0–1.0)

## 2013-02-10 MED ORDER — METHOCARBAMOL 500 MG PO TABS: 500.0000 mg | ORAL_TABLET | Freq: Two times a day (BID) | ORAL | Status: DC

## 2013-02-10 MED ORDER — HYDROCODONE-ACETAMINOPHEN 5-325 MG PO TABS
1.0000 | ORAL_TABLET | Freq: Once | ORAL | Status: AC
  Administered 2013-02-10: 1 via ORAL
  Filled 2013-02-10: qty 1

## 2013-02-10 MED ORDER — NAPROXEN 500 MG PO TABS: 500.0000 mg | ORAL_TABLET | Freq: Two times a day (BID) | ORAL | Status: DC

## 2013-02-10 NOTE — ED Notes (Signed)
Patient transported to CT 

## 2013-02-10 NOTE — ED Notes (Signed)
Patient transported to X-ray 
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
## 2013-06-19 ENCOUNTER — Emergency Department (HOSPITAL_COMMUNITY): Payer: Self-pay

## 2013-06-19 ENCOUNTER — Encounter (HOSPITAL_COMMUNITY): Payer: Self-pay | Admitting: Emergency Medicine

## 2013-06-19 ENCOUNTER — Emergency Department (HOSPITAL_COMMUNITY)
Admission: EM | Admit: 2013-06-19 | Discharge: 2013-06-19 | Disposition: A | Discharge status: Home / Self Care | Payer: Self-pay | Attending: Emergency Medicine | Admitting: Emergency Medicine

## 2013-06-19 DIAGNOSIS — R002 Palpitations: Secondary | ICD-10-CM | POA: Insufficient documentation

## 2013-06-19 DIAGNOSIS — Z8669 Personal history of other diseases of the nervous system and sense organs: Secondary | ICD-10-CM | POA: Insufficient documentation

## 2013-06-19 DIAGNOSIS — J3489 Other specified disorders of nose and nasal sinuses: Secondary | ICD-10-CM | POA: Insufficient documentation

## 2013-06-19 DIAGNOSIS — R42 Dizziness and giddiness: Secondary | ICD-10-CM | POA: Insufficient documentation

## 2013-06-19 DIAGNOSIS — R000 Tachycardia, unspecified: Secondary | ICD-10-CM | POA: Insufficient documentation

## 2013-06-19 DIAGNOSIS — E860 Dehydration: Secondary | ICD-10-CM | POA: Insufficient documentation

## 2013-06-19 DIAGNOSIS — H5789 Other specified disorders of eye and adnexa: Secondary | ICD-10-CM | POA: Insufficient documentation

## 2013-06-19 DIAGNOSIS — M436 Torticollis: Secondary | ICD-10-CM | POA: Insufficient documentation

## 2013-06-19 DIAGNOSIS — R51 Headache: Secondary | ICD-10-CM | POA: Insufficient documentation

## 2013-06-19 DIAGNOSIS — R079 Chest pain, unspecified: Secondary | ICD-10-CM

## 2013-06-19 DIAGNOSIS — R0609 Other forms of dyspnea: Secondary | ICD-10-CM | POA: Insufficient documentation

## 2013-06-19 DIAGNOSIS — R0989 Other specified symptoms and signs involving the circulatory and respiratory systems: Secondary | ICD-10-CM | POA: Insufficient documentation

## 2013-06-19 DIAGNOSIS — R5383 Other fatigue: Secondary | ICD-10-CM

## 2013-06-19 DIAGNOSIS — F17200 Nicotine dependence, unspecified, uncomplicated: Secondary | ICD-10-CM | POA: Insufficient documentation

## 2013-06-19 DIAGNOSIS — R5381 Other malaise: Secondary | ICD-10-CM | POA: Insufficient documentation

## 2013-06-19 DIAGNOSIS — I10 Essential (primary) hypertension: Secondary | ICD-10-CM | POA: Insufficient documentation

## 2013-06-19 HISTORY — DX: Chest pain, unspecified: R07.9

## 2013-06-19 HISTORY — DX: Cluster headache syndrome, unspecified, not intractable: G44.009

## 2013-06-19 LAB — BASIC METABOLIC PANEL
BUN: 9 mg/dL (ref 6–23)
BUN: 9 mg/dL (ref 6–23)
BUN: 10 mg/dL (ref 6–23)
CHLORIDE: 95 meq/L — AB (ref 96–112)
CHLORIDE: 100 meq/L (ref 96–112)
CO2: 17 mEq/L — ABNORMAL LOW (ref 19–32)
CO2: 20 mEq/L (ref 19–32)
CO2: 19 meq/L (ref 19–32)
Calcium: 9.5 mg/dL (ref 8.4–10.5)
Calcium: 9.6 mg/dL (ref 8.4–10.5)
Calcium: 8.5 mg/dL (ref 8.4–10.5)
Chloride: 95 mEq/L — ABNORMAL LOW (ref 96–112)
Creatinine, Ser: 0.96 mg/dL (ref 0.50–1.35)
Creatinine, Ser: 0.98 mg/dL (ref 0.50–1.35)
Creatinine, Ser: 0.95 mg/dL (ref 0.50–1.35)
GFR calc Af Amer: >90 mL/min (ref >90)
GFR calc Af Amer: >90 mL/min (ref >90)
GFR calc non Af Amer: >90 mL/min (ref >90)
GFR calc non Af Amer: >90 mL/min (ref >90)
GLUCOSE: 83 mg/dL (ref 70–99)
GLUCOSE: 72 mg/dL (ref 70–99)
Glucose, Bld: 177 mg/dL — ABNORMAL HIGH (ref 70–99)
POTASSIUM: 3.9 meq/L (ref 3.7–5.3)
Potassium: 4.7 mEq/L (ref 3.7–5.3)
Potassium: 5.7 mEq/L — ABNORMAL HIGH (ref 3.7–5.3)
SODIUM: 138 meq/L (ref 137–147)
Sodium: 138 mEq/L (ref 137–147)
Sodium: 137 mEq/L (ref 137–147)

## 2013-06-19 LAB — CBC WITH DIFFERENTIAL/PLATELET
BASOS PCT: 1 % (ref 0–1)
Basophils Absolute: 0.1 10*3/uL (ref 0.0–0.1)
EOS PCT: 0 % (ref 0–5)
Eosinophils Absolute: 0 10*3/uL (ref 0.0–0.7)
HCT: 48.6 % (ref 39.0–52.0)
Hemoglobin: 17.6 g/dL — ABNORMAL HIGH (ref 13.0–17.0)
LYMPHS ABS: 1.4 10*3/uL (ref 0.7–4.0)
Lymphocytes Relative: 25 % (ref 12–46)
MCH: 33.1 pg (ref 26.0–34.0)
MCHC: 36.2 g/dL — AB (ref 30.0–36.0)
MCV: 91.5 fL (ref 78.0–100.0)
Monocytes Absolute: 0.4 10*3/uL (ref 0.1–1.0)
Monocytes Relative: 6 % (ref 3–12)
Neutro Abs: 3.8 10*3/uL (ref 1.7–7.7)
Neutrophils Relative %: 68 % (ref 43–77)
PLATELETS: 283 10*3/uL (ref 150–400)
RBC: 5.31 MIL/uL (ref 4.22–5.81)
RDW: 13.4 % (ref 11.5–15.5)
WBC: 5.7 10*3/uL (ref 4.0–10.5)

## 2013-06-19 LAB — D-DIMER, QUANTITATIVE: D-Dimer, Quant: <0.27 ug/mL-FEU (ref 0.00–0.48)

## 2013-06-19 LAB — PRO B NATRIURETIC PEPTIDE: PRO B NATRI PEPTIDE: 27.4 pg/mL (ref 0–125)

## 2013-06-19 LAB — CG4 I-STAT (LACTIC ACID): Lactic Acid, Venous: 2.22 mmol/L — ABNORMAL HIGH (ref 0.5–2.2)

## 2013-06-19 LAB — POCT I-STAT TROPONIN I
Troponin i, poc: 0.01 ng/mL (ref 0.00–0.08)
Troponin i, poc: 0.02 ng/mL (ref 0.00–0.08)

## 2013-06-19 MED ORDER — SODIUM CHLORIDE 0.9 % IV BOLUS (SEPSIS)
1000.0000 mL | Freq: Once | INTRAVENOUS | Status: AC
  Administered 2013-06-19: 1000 mL via INTRAVENOUS

## 2013-06-19 MED ORDER — NITROGLYCERIN 0.4 MG SL SUBL
0.4000 mg | SUBLINGUAL_TABLET | SUBLINGUAL | Status: DC | PRN
  Administered 2013-06-19 (×2): 0.4 mg via SUBLINGUAL
  Filled 2013-06-19: qty 25
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
## 2013-06-19 NOTE — ED Notes (Signed)
Pt arrives to ed via [facility] for substernal, non radiating CP onset 1030.  ems admin, 324 asa, 2- SL nitro. Pt sts pain relieved.  Pt sts feels like chest "pressure".  Pt caox4, pmsx4, nad.  NSR.  Pt denies recent illness/injury.

## 2013-06-19 NOTE — ED Notes (Signed)
Discharge inst reviewed.  Voiced understanding.  Instructed to return if CP returns, becomes dizzy or light headed, etc

## 2013-06-19 NOTE — Discharge Instructions (Signed)
Call Cardiology first thing in the morning to set up follow up appointment. Return to emergency department immediately or call 911 if you develop any signs of chest pain, Shortness of breath, fever/chills, Headache,  Nausea/vomiting, or dizziness.   Palpitations   A palpitation is the feeling that your heartbeat is irregular or is faster than normal. It may feel like your heart is fluttering or skipping a beat. Palpitations are usually not a serious problem. However, in some cases, you may need further medical evaluation.  CAUSES  Palpitations can be caused by:  Smoking.  Caffeine or other stimulants, such as diet pills or energy drinks.  Alcohol.  Stress and anxiety.  Strenuous physical activity.  Fatigue.  Certain medicines.  Heart disease, especially if you have a history of arrhythmias. This includes atrial fibrillation, atrial flutter, or supraventricular tachycardia.  An improperly working pacemaker or defibrillator. DIAGNOSIS  To find the cause of your palpitations, your caregiver will take your history and perform a physical exam. Tests may also be done, including:  Electrocardiography (ECG). This test records the heart's electrical activity.  Cardiac monitoring. This allows your caregiver to monitor your heart rate and rhythm in real time.  Holter monitor. This is a portable device that records your heartbeat and can help diagnose heart arrhythmias. It allows your caregiver to track your heart activity for several days, if needed.  Stress tests by exercise or by giving medicine that makes the heart beat faster. TREATMENT  Treatment of palpitations depends on the cause of your symptoms and can vary greatly. Most cases of palpitations do not require any treatment other than time, relaxation, and monitoring your symptoms. Other causes, such as atrial fibrillation, atrial flutter, or supraventricular tachycardia, usually require further treatment.  HOME CARE INSTRUCTIONS  Avoid:    Caffeinated coffee, tea, soft drinks, diet pills, and energy drinks.  Chocolate.  Alcohol.  Stop smoking if you smoke.  Reduce your stress and anxiety. Things that can help you relax include:  A method that measures bodily functions so you can learn to control them (biofeedback).  Yoga.  Meditation.  Physical activity such as swimming, jogging, or walking.  Get plenty of rest and sleep. SEEK MEDICAL CARE IF:  You continue to have a fast or irregular heartbeat beyond 24 hours.  Your palpitations occur more often. SEEK IMMEDIATE MEDICAL CARE IF:  You develop chest pain or shortness of breath.  You have a severe headache.  You feel dizzy, or you faint. MAKE SURE YOU:  Understand these instructions.  Will watch your condition.  Will get help right away if you are not doing well or get worse. Document Released: 04/17/2000 Document Revised: 08/15/2012 Document Reviewed: 06/19/2011  [facility] Patient Information 2014 [employer], [place].
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
## 2013-06-19 NOTE — ED Notes (Signed)
Myself and Deborah, NT undressed pt, placed on monitor, continuous pulse oximetry and blood pressure cuff; EKG (performed by Gavin Poundeborah, NT) and vitals were performed by me

## 2013-06-19 NOTE — ED Notes (Signed)
Cardiologist at bedside.  

## 2013-06-19 NOTE — ED Notes (Signed)
Pt appears diaphoretic. Thermostat changed. Pt still reports pressure in chest

## 2013-06-19 NOTE — ED Notes (Signed)
Pt reports increasing headache. Will not administer 3rd nitro. Pt resting in bed.

## 2013-06-20 NOTE — ED Provider Notes (Signed)
Medical screening examination/treatment/procedure(s) were conducted as a shared visit with non-physician practitioner(s) and myself.  I personally evaluated the patient during the encounter.  4 hours of constant L sided chest pain, nonradiating, associated with nausea and vertigo. Similar admit in Sept 2014, could not complete stress test.  EKG sinus tachy.  Orthostatics positive. Troponin and D dimer negative. Hemoconcentration with anion gap acidosis likely 2/2 dehydration  EKG Interpretation    Date/Time:  Monday June 19 2013 14:36:39 EST Ventricular Rate:  116 PR Interval:  112 QRS Duration: 91 QT Interval:  375 QTC Calculation: 521 R Axis:   84 Text Interpretation:  Sinus tachycardia Ventricular premature complex Biatrial enlargement Probable left ventricular hypertrophy Prolonged QT interval peaked T waves, tachycardia Confirmed by Manus GunningANCOUR  MD, Arianah Torgeson ([id number]) on 06/19/2013 3:09:24 PM              Glynn OctaveStephen Ferdinando Lodge, MD 06/20/13 [phone]
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
## 2013-11-21 IMAGING — CR DG LUMBAR SPINE 2-3V
3 series · 3 of 3 positions shown · non-contrast
Comparison: 11/08/2012.

EXAM:
LUMBAR SPINE - 2-3 VIEW

[t l-spine a.p.]
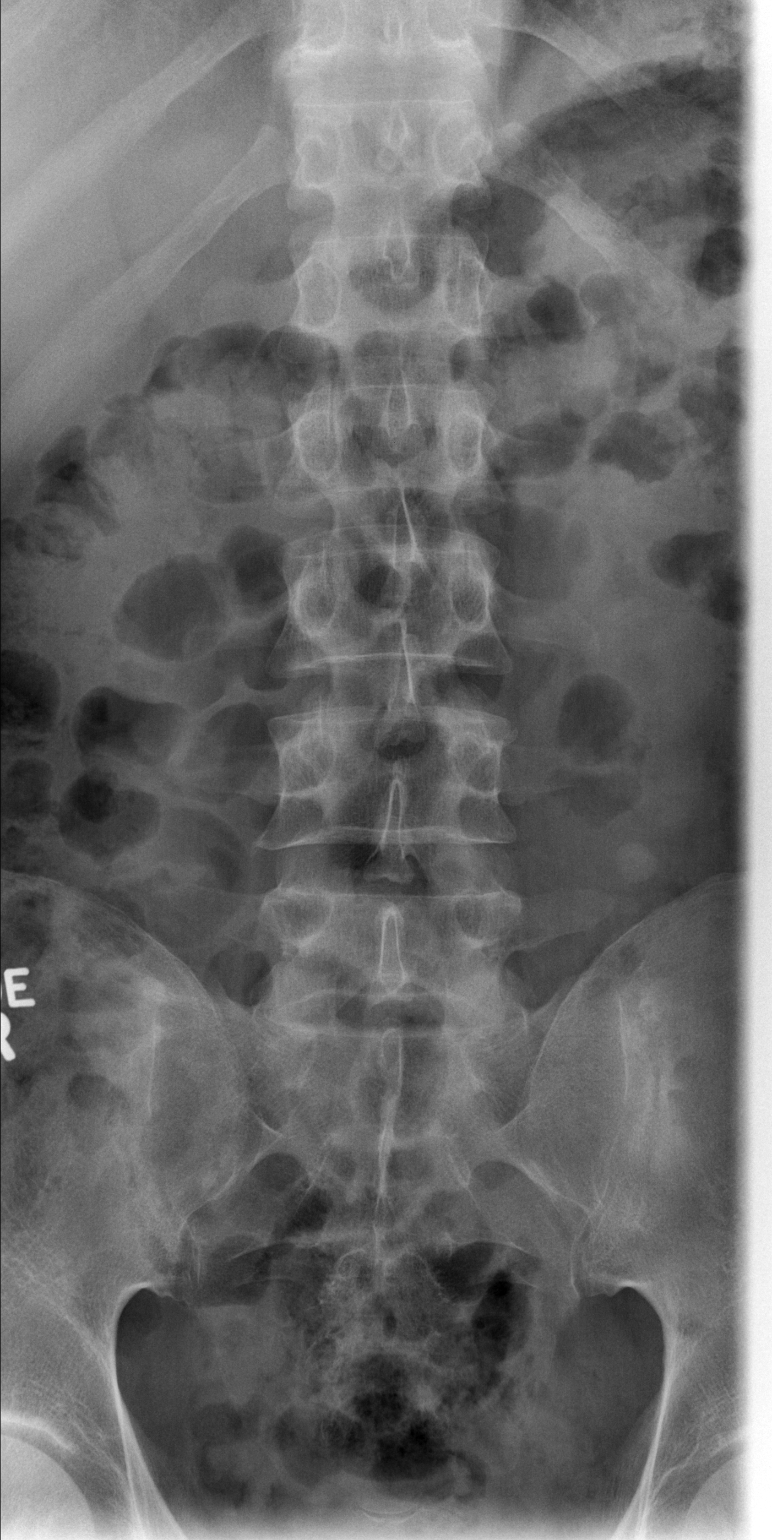

[t l-spine lat]
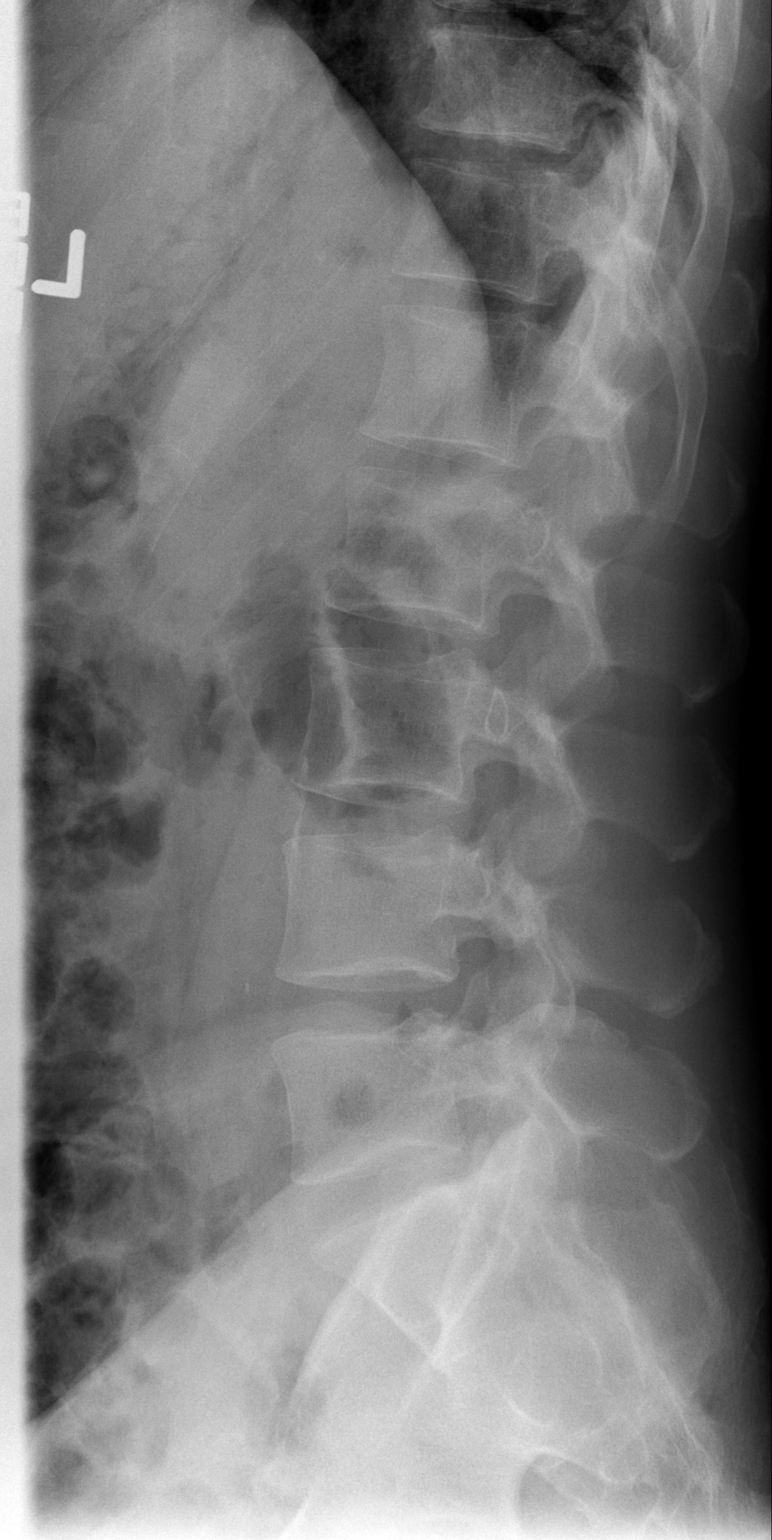

[t l-spine l5-s1 spot]
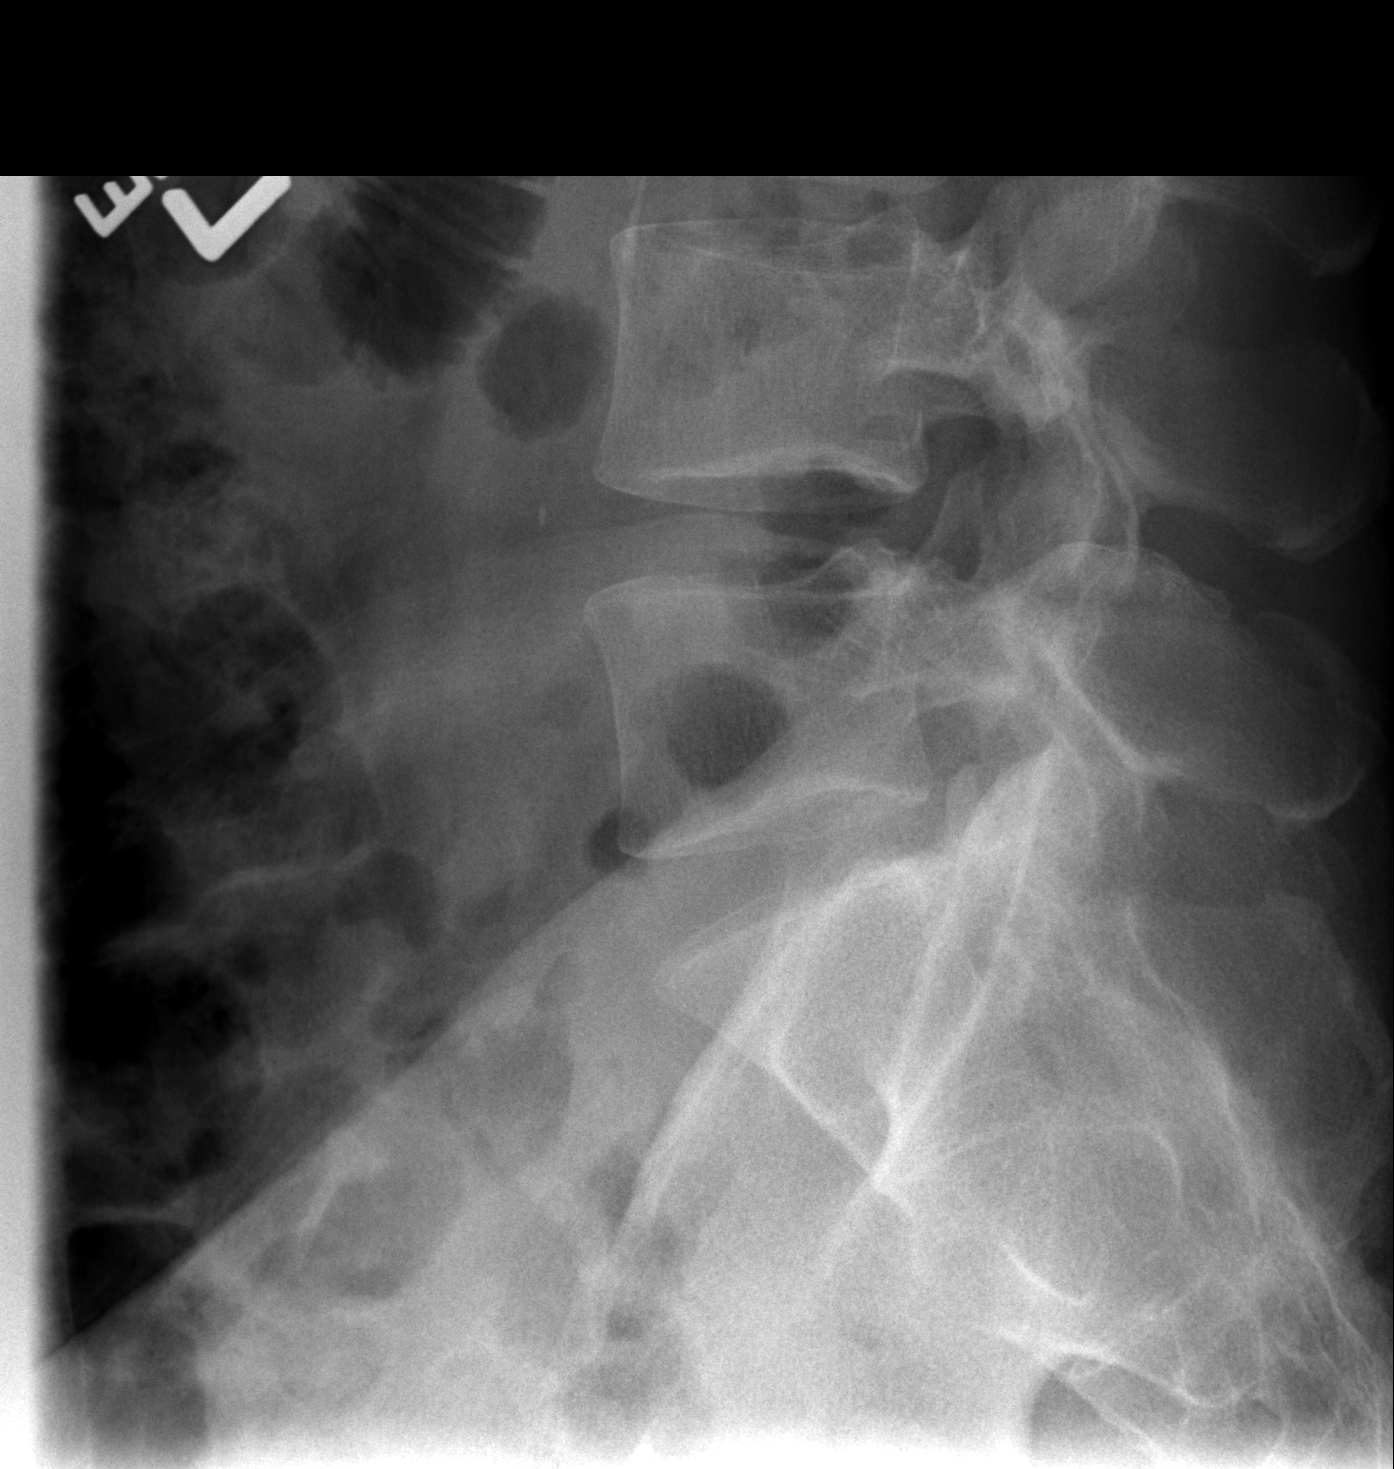

[3 of 3 positions shown; findings below may reference images not displayed]

FINDINGS: No fracture or spondylolisthesis. There are no significant
degenerative changes.

There is a rounded density in the left lower abdomen just lateral to
the L4-L5 disc space on the AP view only. This is nonspecific. It
may reside in bowel.
IMPRESSION: No fracture or acute finding.

## 2021-08-17 ENCOUNTER — Emergency Department (HOSPITAL_COMMUNITY): Payer: 59

## 2021-08-17 ENCOUNTER — Encounter (HOSPITAL_COMMUNITY): Payer: Self-pay

## 2021-08-17 ENCOUNTER — Emergency Department (HOSPITAL_COMMUNITY)
Admission: EM | Admit: 2021-08-17 | Discharge: 2021-08-17 | Disposition: A | Discharge status: Home / Self Care | Payer: 59 | Attending: Emergency Medicine | Admitting: Emergency Medicine

## 2021-08-17 DIAGNOSIS — S39012A Strain of muscle, fascia and tendon of lower back, initial encounter: Secondary | ICD-10-CM | POA: Insufficient documentation

## 2021-08-17 DIAGNOSIS — M62830 Muscle spasm of back: Secondary | ICD-10-CM | POA: Diagnosis not present

## 2021-08-17 DIAGNOSIS — S3992XA Unspecified injury of lower back, initial encounter: Secondary | ICD-10-CM | POA: Diagnosis present

## 2021-08-17 DIAGNOSIS — X500XXA Overexertion from strenuous movement or load, initial encounter: Secondary | ICD-10-CM | POA: Diagnosis not present

## 2021-08-17 MED ORDER — ONDANSETRON 4 MG PO TBDP
4.0000 mg | ORAL_TABLET | Freq: Once | ORAL | Status: AC
  Administered 2021-08-17: 4 mg via ORAL
  Filled 2021-08-17: qty 1

## 2021-08-17 MED ORDER — OXYCODONE-ACETAMINOPHEN 5-325 MG PO TABS
2.0000 | ORAL_TABLET | Freq: Once | ORAL | Status: AC
  Administered 2021-08-17: 2 via ORAL
  Filled 2021-08-17: qty 2

## 2021-08-17 MED ORDER — CELECOXIB 200 MG PO CAPS: 200.0000 mg | ORAL_CAPSULE | Freq: Two times a day (BID) | ORAL | 0 refills | Status: AC

## 2021-08-17 MED ORDER — CYCLOBENZAPRINE HCL 10 MG PO TABS: 5.0000 mg | ORAL_TABLET | Freq: Two times a day (BID) | ORAL | 0 refills | Status: AC | PRN

## 2021-08-17 MED ORDER — OXYCODONE HCL 5 MG PO TABS: 2.5000 mg | ORAL_TABLET | Freq: Four times a day (QID) | ORAL | 0 refills | Status: AC | PRN

## 2021-08-17 MED ORDER — METHYLPREDNISOLONE 4 MG PO TBPK: ORAL_TABLET | ORAL | 0 refills | Status: AC

## 2021-08-17 NOTE — ED Provider Triage Note (Signed)
Emergency Medicine Provider Triage Evaluation Note ? ?Benjamin John Sr. , a 56 y.o. male  was evaluated in triage.  Pt complains of back pain at the cocyx area. Pain radiates down both legs posteriorly. Had a brief episode of similar pain before but it resolved. Unloading a truck and pain after lifting heavy box Monday. Pain has been progressively worsening. Sever pain with change of position. + subjective leg weakness. Took hydorocone yesterday with some relief. ? ?Review of Systems  ?Positive: Back pain ?Negative:  Saddle anesthesia or bowel incontinence ? ?Physical Exam  ?BP (!) 155/90 (BP Location: Left Arm)   Pulse 95   Temp 98.3 ?F (36.8 ?C) (Oral)   Resp 18   SpO2 98%  ?Gen:   Awake, no distress   ?Resp:  Normal effort  ?MSK:   Moves extremities without difficulty  ?Other:  Back pain, ttp over the sacrum. Normal strength at the ankles ? ?Medical Decision Making  ?Medically screening exam initiated at 9:49 AM.  Appropriate orders placed.  Benjamin John Sr. was informed that the remainder of the evaluation will be completed by another provider, this initial triage assessment does not replace that evaluation, and the importance of remaining in the ED until their evaluation is complete. ? ?Appropriate orders placed. ?  Margarita Mail, PA-C ?08/17/21 [id number] ? ?

## 2021-08-17 NOTE — ED Triage Notes (Signed)
Pt presents with c/o back pain that started on Monday after moving and lifting. Pt reports the pain is in his lower back. ?

## 2021-08-17 NOTE — ED Provider Notes (Signed)
?[facility]-EMERGENCY DEPT ?Provider Note ? ? ?CSN: [id number] ?Arrival date & time: 08/17/21  0931 ? ?  ? ?History ? ?Chief Complaint  ?Patient presents with  ? Back Pain  ? ? ?Benjamin Squibb Sr. is a 56 y.o. male  who complains of back pain at the cocyx area. Pain radiates down both legs posteriorly. Had a brief episode of similar pain in the past, but it resolved. Pt was  Unloading a truck 2 days ago and pain began after lifting heavy box Monday. Pain has been progressively worsening. Sever pain with change of position.  subjective leg weakness. Took hydorocone yesterday with some relief. ? ? ?Back Pain ? ?  ? ?Home Medications ?Prior to Admission medications   ?Medication Sig Start Date End Date Taking? Authorizing Provider  ?celecoxib (CELEBREX) 200 MG capsule Take 1 capsule (200 mg total) by mouth 2 (two) times daily. 08/17/21  Yes Arthor Captain, PA-C  ?cyclobenzaprine (FLEXERIL) 10 MG tablet Take 0.5-1 tablets (5-10 mg total) by mouth 2 (two) times daily as needed for muscle spasms. 08/17/21  Yes Arthor Captain, PA-C  ?methylPREDNISolone (MEDROL DOSEPAK) 4 MG TBPK tablet Use as directed 08/17/21  Yes Amanii Snethen, PA-C  ?oxyCODONE (ROXICODONE) 5 MG immediate release tablet Take 0.5-1 tablets (2.5-5 mg total) by mouth every 6 (six) hours as needed for severe pain. 08/17/21  Yes Arthor Captain, PA-C  ?   ? ?Allergies    ?Diphenhydramine hcl   ? ?Review of Systems   ?Review of Systems  ?Musculoskeletal:  Positive for back pain.  ? ?Physical Exam ?Updated Vital Signs ?BP (!) 155/90 (BP Location: Left Arm)   Pulse 95   Temp 98.3 ?F (36.8 ?C) (Oral)   Resp 18   SpO2 98%  ?Physical Exam ?Vitals and nursing note reviewed.  ?Constitutional:   ?   General: He is not in acute distress. ?   Appearance: He is well-developed. He is not diaphoretic.  ?HENT:  ?   Head: Normocephalic and atraumatic.  ?Eyes:  ?   General: No scleral icterus. ?   Conjunctiva/sclera: Conjunctivae normal.   ?Cardiovascular:  ?   Rate and Rhythm: Normal rate and regular rhythm.  ?   Heart sounds: Normal heart sounds.  ?Pulmonary:  ?   Effort: Pulmonary effort is normal. No respiratory distress.  ?   Breath sounds: Normal breath sounds.  ?Abdominal:  ?   Palpations: Abdomen is soft.  ?   Tenderness: There is no abdominal tenderness.  ?Musculoskeletal:  ?   Cervical back: Normal range of motion and neck supple.  ?   Comments: Patient appears to be in mild to moderate pain, antalgic gait noted. Lumbosacral spine area reveals tenderness over the sacrum.  Painful and reduced LS ROM noted.. DTR's, motor strength and sensation normal, including heel and toe gait.  Peripheral pulses are palpable. ?  ?Skin: ?   General: Skin is warm and dry.  ?Neurological:  ?   Mental Status: He is alert.  ?Psychiatric:     ?   Behavior: Behavior normal.  ? ? ?ED Results / Procedures / Treatments   ?Labs ?(all labs ordered are listed, but only abnormal results are displayed) ?Labs Reviewed - No data to display ? ?EKG ?None ? ?Radiology ?DG Lumbar Spine Complete ? ?Result Date: 08/17/2021 ?CLINICAL DATA:  Low back pain for 1 week after lifting heavy object. EXAM: LUMBAR SPINE - COMPLETE 4+ VIEW COMPARISON:  08/11/2021 FINDINGS: There is no evidence of lumbar spine  fracture. Alignment is normal. Intervertebral disc spaces are maintained. Evidence of facet arthropathy or other osseous abnormality. Mild atherosclerotic calcification of abdominal aorta incidentally noted. IMPRESSION: No radiographic abnormality of lumbar spine. Electronically Signed   By: Danae Orleans M.D.   On: 08/17/2021 11:01  ? ?DG Sacrum/Coccyx ? ?Result Date: 08/17/2021 ?CLINICAL DATA:  Sacrococcygeal pain for 1 week following lifting heavy object.h EXAM: SACRUM AND COCCYX - 2+ VIEW COMPARISON:  None. FINDINGS: There is no evidence of fracture or other focal bone lesions. Bilateral hip osteoarthritis incidentally noted, right side greater than left. IMPRESSION: No acute  findings. Bilateral hip osteoarthritis. Electronically Signed   By: Danae Orleans M.D.   On: 08/17/2021 11:03   ? ?Procedures ?Procedures  ? ?Medications Ordered in ED ?Medications  ?oxyCODONE-acetaminophen (PERCOCET/ROXICET) 5-325 MG per tablet 2 tablet (2 tablets Oral Given 08/17/21 1001)  ?ondansetron (ZOFRAN-ODT) disintegrating tablet 4 mg (4 mg Oral Given 08/17/21 1001)  ? ? ?ED Course/ Medical Decision Making/ A&P ?Clinical Course as of 08/17/21 1151  ?Sun Aug 17, 2021  ?1138 DG Sacrum/Coccyx [AH]  ?1138 DG Lumbar Spine Complete ?I visualized images of lumbar spine and sacrum. No acute findings. [AH]  ?  ?Clinical Course User Index ?[AH] Arthor Captain, PA-C  ? ?                        ?Medical Decision Making ?Patient with back pain.  No neurological deficits and normal neuro exam.  Patient can walk but states is painful.  No loss of bowel or bladder control.  No concern for cauda equina.  No fever, night sweats, weight loss, h/o cancer, IVDU.  RICE protocol and pain medicine indicated and discussed with patient.  ?PDMP reviewed during this encounter. ? ? ?Problems Addressed: ?Muscle spasm of back: acute illness or injury ?Strain of lumbar region, initial encounter: acute illness or injury ? ?Amount and/or Complexity of Data Reviewed ?Radiology: ordered and independent interpretation performed. Decision-making details documented in ED Course. ? ?Risk ?Prescription drug management. ? ?Final Clinical Impression(s) / ED Diagnoses ?Final diagnoses:  ?Muscle spasm of back  ?Strain of lumbar region, initial encounter  ? ? ?Rx / DC Orders ?ED Discharge Orders   ? ?      Ordered  ?  oxyCODONE (ROXICODONE) 5 MG immediate release tablet  Every 6 hours PRN       ? 08/17/21 1146  ?  cyclobenzaprine (FLEXERIL) 10 MG tablet  2 times daily PRN       ? 08/17/21 1146  ?  celecoxib (CELEBREX) 200 MG capsule  2 times daily       ? 08/17/21 1146  ?  methylPREDNISolone (MEDROL DOSEPAK) 4 MG TBPK tablet       ? 08/17/21 1146  ? ?  ?   ? ?  ? ? ?  ?Arthor Captain, PA-C ?08/17/21 1151 ? ?  ?Jacalyn Lefevre, MD ?08/21/21 [phone] ? ?

## 2021-08-17 NOTE — Discharge Instructions (Signed)
SEEK IMMEDIATE MEDICAL ATTENTION IF: New numbness, tingling, weakness, or problem with the use of your arms or legs.  Severe back pain not relieved with medications.  Change in bowel or bladder control.  Increasing pain in any areas of the body (such as chest or abdominal pain).  Shortness of breath, dizziness or fainting.  Nausea (feeling sick to your stomach), vomiting, fever, or sweats.  

## 2022-08-25 ENCOUNTER — Emergency Department (HOSPITAL_COMMUNITY)
Admission: EM | Admit: 2022-08-25 | Discharge: 2022-08-25 | Disposition: A | Discharge status: Home / Self Care | Payer: 59 | Attending: Emergency Medicine | Admitting: Emergency Medicine

## 2022-08-25 ENCOUNTER — Encounter (HOSPITAL_COMMUNITY): Payer: Self-pay

## 2022-08-25 DIAGNOSIS — M25551 Pain in right hip: Secondary | ICD-10-CM | POA: Insufficient documentation

## 2022-08-25 DIAGNOSIS — G8929 Other chronic pain: Secondary | ICD-10-CM | POA: Diagnosis not present

## 2022-08-25 MED ORDER — KETOROLAC TROMETHAMINE 15 MG/ML IJ SOLN
15.0000 mg | Freq: Once | INTRAMUSCULAR | Status: AC
  Administered 2022-08-25: 15 mg via INTRAMUSCULAR
  Filled 2022-08-25: qty 1

## 2022-08-25 MED ORDER — PREDNISONE 10 MG PO TABS: 40.0000 mg | ORAL_TABLET | Freq: Every day | ORAL | 0 refills | Status: AC

## 2022-08-25 MED ORDER — PREDNISONE 20 MG PO TABS
40.0000 mg | ORAL_TABLET | Freq: Once | ORAL | Status: AC
  Administered 2022-08-25: 40 mg via ORAL
  Filled 2022-08-25: qty 2

## 2022-08-25 MED ORDER — LIDOCAINE 5 % EX PTCH: 1.0000 | MEDICATED_PATCH | CUTANEOUS | 0 refills | Status: AC

## 2022-08-25 NOTE — ED Provider Notes (Signed)
[place] EMERGENCY DEPARTMENT AT [facility] Provider Note   CSN: [id number] Arrival date & time: 08/25/22  6213     History  Chief Complaint  Patient presents with   Hip Pain    Benjamin Novak Sr. is a 57 y.o. male.  57 year old male with prior medical history as detailed below presents for evaluation.  Patient with longstanding history of right hip pain.  Patient reports increased pain over the last several days.  Patient is reporting that he actually has an orthopedic appointment scheduled for tomorrow for evaluation of the same pain.  Patient reports that several months ago he had a fall at work.  He feels that this may have started his chronic pain.  He reports an evaluation previously including x-rays that did not reveal evidence of fracture or other significant pathology.  Patient reports that his pain is not well-controlled with over-the-counter aspirin at home.  The history is provided by the patient and medical records.       Home Medications Prior to Admission medications   Medication Sig Start Date End Date Taking? Authorizing Provider  celecoxib (CELEBREX) 200 MG capsule Take 1 capsule (200 mg total) by mouth 2 (two) times daily. 08/17/21   Arthor Captain, PA-C  cyclobenzaprine (FLEXERIL) 10 MG tablet Take 0.5-1 tablets (5-10 mg total) by mouth 2 (two) times daily as needed for muscle spasms. 08/17/21   Arthor Captain, PA-C  methylPREDNISolone (MEDROL DOSEPAK) 4 MG TBPK tablet Use as directed 08/17/21   Arthor Captain, PA-C  oxyCODONE (ROXICODONE) 5 MG immediate release tablet Take 0.5-1 tablets (2.5-5 mg total) by mouth every 6 (six) hours as needed for severe pain. 08/17/21   Arthor Captain, PA-C      Allergies    Diphenhydramine hcl    Review of Systems   Review of Systems  All other systems reviewed and are negative.   Physical Exam Updated Vital Signs BP (!) 185/104 (BP Location: Left Arm)   Pulse 61   Temp (!) 97.4 F (36.3 C)  (Oral)   Resp 17   SpO2 100%  Physical Exam Vitals and nursing note reviewed.  Constitutional:      General: He is not in acute distress.    Appearance: Normal appearance. He is well-developed.  HENT:     Head: Normocephalic and atraumatic.  Eyes:     Conjunctiva/sclera: Conjunctivae normal.     Pupils: Pupils are equal, round, and reactive to light.  Cardiovascular:     Rate and Rhythm: Normal rate and regular rhythm.     Heart sounds: Normal heart sounds.  Pulmonary:     Effort: Pulmonary effort is normal. No respiratory distress.     Breath sounds: Normal breath sounds.  Abdominal:     General: There is no distension.     Palpations: Abdomen is soft.     Tenderness: There is no abdominal tenderness.  Musculoskeletal:        General: Tenderness present. No deformity. Normal range of motion.     Cervical back: Normal range of motion and neck supple.     Comments: Localizes pain to the anterior lateral aspect of the right hip.  Pain radiates into the right low back.  Patient is ambulatory without antalgic gait.  Distal right lower extremity is neurovascular intact.  Skin:    General: Skin is warm and dry.  Neurological:     General: No focal deficit present.     Mental Status: He is alert and oriented  to person, place, and time.     ED Results / Procedures / Treatments   Labs (all labs ordered are listed, but only abnormal results are displayed) Labs Reviewed - No data to display  EKG None  Radiology No results found.  Procedures Procedures    Medications Ordered in ED Medications  predniSONE (DELTASONE) tablet 40 mg (has no administration in time range)  ketorolac (TORADOL) 15 MG/ML injection 15 mg (has no administration in time range)    ED Course/ Medical Decision Making/ A&P                             Medical Decision Making Risk Prescription drug management.    Medical Screen Complete  This patient presented to the ED with complaint of  chronic right hip pain.  This complaint involves an extensive number of treatment options. The initial differential diagnosis includes, but is not limited to, chronic right hip pain  This presentation is: Chronic  Patient is presenting with complaint of chronic right hip pain.  Patient reports that he may have exacerbated his chronic pain over the last several days.  He is unsure of what he may have done that causes pain to increase.  He reports poor pain control with use of BC powders at home.  Patient does have already established orthopedic evaluation scheduled for tomorrow.  Patient is agreeable with trial of PO prednisone and Lidoderm patch.  Patient declines imaging today.  He reports prior imaging did not show evidence of fracture or other acute pathology.  Importance of close follow-up is stressed.  Strict return precautions given and understood.   Additional history obtained:  External records from outside sources obtained and reviewed including prior ED visits and prior Inpatient records.   Medicines ordered:  I ordered medication including Toradol, prednisone for pain Reevaluation of the patient after these medicines showed that the patient: improved   Problem List / ED Course:  Chronic right hip pain   Reevaluation:  After the interventions noted above, I reevaluated the patient and found that they have: improved  Disposition:  After consideration of the diagnostic results and the patients response to treatment, I feel that the patent would benefit from close outpatient follow up.          Final Clinical Impression(s) / ED Diagnoses Final diagnoses:  Right hip pain    Rx / DC Orders ED Discharge Orders          Ordered    lidocaine (LIDODERM) 5 %  Every 24 hours        08/25/22 0845    predniSONE (DELTASONE) 10 MG tablet  Daily        08/25/22 0845              Wynetta Fines, MD 08/25/22 [phone]

## 2022-08-25 NOTE — ED Triage Notes (Signed)
Pt presents with c/o right hip pain. Pt reports he fell recently, unsure of exactly when, but reports that the pain has been increasing since his fall and today it is worse than it has been.

## 2022-08-25 NOTE — Discharge Instructions (Addendum)
Return for any problem.  ?
